# Patient Record
Sex: Female | Born: 1975 | Race: Black or African American | Hispanic: No | Marital: Married | Smoking: Current some day smoker
Health system: Southern US, Community
[De-identification: ages and names within clinical notes are randomized; demographics above are authoritative.]

## PROBLEM LIST (undated history)

## (undated) ENCOUNTER — Inpatient Hospital Stay (HOSPITAL_COMMUNITY): Payer: Self-pay

## (undated) DIAGNOSIS — K5090 Crohn's disease, unspecified, without complications: Secondary | ICD-10-CM

## (undated) DIAGNOSIS — K219 Gastro-esophageal reflux disease without esophagitis: Secondary | ICD-10-CM

## (undated) DIAGNOSIS — O09529 Supervision of elderly multigravida, unspecified trimester: Secondary | ICD-10-CM

## (undated) DIAGNOSIS — K220 Achalasia of cardia: Secondary | ICD-10-CM

## (undated) DIAGNOSIS — D219 Benign neoplasm of connective and other soft tissue, unspecified: Secondary | ICD-10-CM

## (undated) HISTORY — DX: Benign neoplasm of connective and other soft tissue, unspecified: D21.9

## (undated) HISTORY — DX: Achalasia of cardia: K22.0

## (undated) HISTORY — PX: ESOPHAGEAL DILATION: SHX303

## (undated) HISTORY — PX: OTHER SURGICAL HISTORY: SHX169

## (undated) HISTORY — DX: Supervision of elderly multigravida, unspecified trimester: O09.529

---

## 2011-03-27 ENCOUNTER — Other Ambulatory Visit: Payer: Self-pay | Admitting: Obstetrics and Gynecology

## 2011-03-27 DIAGNOSIS — N63 Unspecified lump in unspecified breast: Secondary | ICD-10-CM

## 2011-03-27 DIAGNOSIS — N644 Mastodynia: Secondary | ICD-10-CM

## 2011-04-13 ENCOUNTER — Ambulatory Visit
Admission: RE | Admit: 2011-04-13 | Discharge: 2011-04-13 | Disposition: A | Discharge status: Home / Self Care | Payer: BC Managed Care – PPO | Source: Ambulatory Visit | Attending: Obstetrics and Gynecology | Admitting: Obstetrics and Gynecology

## 2011-04-13 DIAGNOSIS — N63 Unspecified lump in unspecified breast: Secondary | ICD-10-CM

## 2011-04-13 DIAGNOSIS — N644 Mastodynia: Secondary | ICD-10-CM

## 2012-02-03 NOTE — L&D Delivery Note (Signed)
Pt had an amniotomy this am with clear fluid. She then progressed along a normal labor curve. She had a short second stage. She had a SVD on one live viable black female infant in the ROA position over a 1st degree midline tear. Placenta S/I. EBL- 400cc/ Tear closed with 3-0 Chromic. Baby to NBN.

## 2012-02-18 LAB — OB RESULTS CONSOLE GC/CHLAMYDIA: Gonorrhea: NEGATIVE

## 2012-02-18 LAB — OB RESULTS CONSOLE ANTIBODY SCREEN: Antibody Screen: NEGATIVE

## 2012-02-18 LAB — OB RESULTS CONSOLE ABO/RH

## 2012-07-26 ENCOUNTER — Inpatient Hospital Stay (HOSPITAL_COMMUNITY)
Admission: AD | Admit: 2012-07-26 | Discharge: 2012-07-26 | Disposition: A | Discharge status: Home / Self Care | Payer: BC Managed Care – PPO | Source: Ambulatory Visit | Attending: Obstetrics and Gynecology | Admitting: Obstetrics and Gynecology

## 2012-07-26 ENCOUNTER — Encounter (HOSPITAL_COMMUNITY): Payer: Self-pay | Admitting: *Deleted

## 2012-07-26 DIAGNOSIS — O4693 Antepartum hemorrhage, unspecified, third trimester: Secondary | ICD-10-CM

## 2012-07-26 DIAGNOSIS — O4690 Antepartum hemorrhage, unspecified, unspecified trimester: Secondary | ICD-10-CM | POA: Insufficient documentation

## 2012-07-26 DIAGNOSIS — N930 Postcoital and contact bleeding: Secondary | ICD-10-CM

## 2012-07-26 HISTORY — DX: Gastro-esophageal reflux disease without esophagitis: K21.9

## 2012-07-26 HISTORY — DX: Crohn's disease, unspecified, without complications: K50.90

## 2012-07-26 LAB — OB RESULTS CONSOLE GBS: GBS: POSITIVE

## 2012-07-26 NOTE — MAU Note (Signed)
Pt G2 P1 at 36wks reports bleeding after intercourse this evening.

## 2012-07-26 NOTE — MAU Provider Note (Signed)
Chief Complaint:  Vaginal Bleeding   First Provider Initiated Contact with Patient 07/26/12 0237     HPI: Dana Mcguire is a 37 y.o. G2P1001 at 108w0d who presents to maternity admissions reporting bleeding after intercourse tonight. She states she saw bright red blood on tissue when she wiped after intercourse. Scant bleeding since then. Denies contractions, leakage of fluid or vaginal bleeding. Has not been told that she has placenta previa or placed on pelvic rest this pregnancy. Good fetal movement.   Past Medical History: Past Medical History  Diagnosis Date  . Crohn's disease   . GERD (gastroesophageal reflux disease)     Past obstetric history: OB History   Grav Para Term Preterm Abortions TAB SAB Ect Mult Living   2 1 1       1      # Outc Date GA Lbr Len/2nd Wgt Sex Del Anes PTL Lv   1 TRM            2 CUR               Past Surgical History: Past Surgical History  Procedure Laterality Date  . Accolysia      Family History: Family History  Problem Relation Age of Onset  . Hypertension Mother   . Alcohol abuse Father   . Cancer Paternal Aunt   . Cancer Maternal Grandfather   . Cancer Paternal Grandfather     Social History: History  Substance Use Topics  . Smoking status: Former Smoker -- 23 years    Types: Cigarettes    Quit date: 07/27/2011  . Smokeless tobacco: Not on file  . Alcohol Use: Yes     Comment: not while pregnant    Allergies: Allergies not on file  Meds:  No prescriptions prior to admission    ROS: Pertinent findings in history of present illness.  Physical Exam  Blood pressure 129/74, pulse 97, temperature 98.2 F (36.8 C), temperature source Oral, resp. rate 16, height 5' 8.6" (1.742 m), weight 100.336 kg (221 lb 3.2 oz). GENERAL: Well-developed, well-nourished female in no acute distress. Anxious. HEENT: normocephalic HEART: normal rate RESP: normal effort ABDOMEN: Soft, non-tender, gravid appropriate for gestational  age EXTREMITIES: Nontender, no edema NEURO: alert and oriented SPECULUM EXAM: NEFG, small amount of thin, bloody mucus, cervix friable.   Dilation: Closed (Finger tip) Effacement (%): 50 Cervical Position: Posterior Exam by:: Ivonne Andrew CNM  FHT:  Baseline 120, moderate variability, accelerations present, no decelerations Contractions: UI   Labs: Fern negative.   Imaging:  No results found.  MAU Course: Discussed amount and circumstances of bleeding with Dr. Claiborne Billings. No further testing needed.  Assessment: 1. PCB (post coital bleeding)   2. Third trimester bleeding, antepartum    Plan: Discharge home Labor precautions and fetal kick counts. Pelvic rest x1 week. Bleeding precautions.    Medication List     As of 07/27/2012  6:19 AM    Notice      You have not been prescribed any medications.        Follow-up Information   Follow up with [facility] On 07/26/2012. (as scheduled)    Contact information:   [address] [place] [address] [phone]      Follow up with [facility] [place] [facility]ERN[facility] ADMISSIONS. (As needed if symptoms worsen)    Contact information:   [address] [id number] [place] [address] [phone]  [place], CNM 07/26/2012 2:10 AM

## 2012-08-23 ENCOUNTER — Telehealth (HOSPITAL_COMMUNITY): Payer: Self-pay | Admitting: *Deleted

## 2012-08-23 ENCOUNTER — Encounter (HOSPITAL_COMMUNITY): Payer: Self-pay | Admitting: *Deleted

## 2012-08-23 NOTE — Telephone Encounter (Signed)
Preadmission screen  

## 2012-08-29 ENCOUNTER — Encounter (HOSPITAL_COMMUNITY): Payer: Self-pay | Admitting: Anesthesiology

## 2012-08-29 ENCOUNTER — Inpatient Hospital Stay (HOSPITAL_COMMUNITY)
Admission: RE | Admit: 2012-08-29 | Discharge: 2012-08-31 | DRG: 373 | Disposition: A | Discharge status: Home / Self Care | Payer: BC Managed Care – PPO | Source: Ambulatory Visit | Attending: Obstetrics and Gynecology | Admitting: Obstetrics and Gynecology

## 2012-08-29 ENCOUNTER — Inpatient Hospital Stay (HOSPITAL_COMMUNITY): Payer: BC Managed Care – PPO | Admitting: Anesthesiology

## 2012-08-29 ENCOUNTER — Encounter (HOSPITAL_COMMUNITY): Payer: Self-pay

## 2012-08-29 DIAGNOSIS — O09529 Supervision of elderly multigravida, unspecified trimester: Secondary | ICD-10-CM | POA: Diagnosis present

## 2012-08-29 LAB — CBC
HCT: 31.7 % — ABNORMAL LOW (ref 36.0–46.0)
Hemoglobin: 11.2 g/dL — ABNORMAL LOW (ref 12.0–15.0)
MCV: 93 fL (ref 78.0–100.0)
RBC: 3.41 MIL/uL — ABNORMAL LOW (ref 3.87–5.11)
WBC: 7.9 10*3/uL (ref 4.0–10.5)

## 2012-08-29 MED ORDER — EPHEDRINE 5 MG/ML INJ: 10.0000 mg | INTRAVENOUS | Status: DC | PRN

## 2012-08-29 MED ORDER — EPHEDRINE 5 MG/ML INJ
10.0000 mg | INTRAVENOUS | Status: DC | PRN
  Filled 2012-08-29: qty 4
  Filled 2012-08-29: qty 2

## 2012-08-29 MED ORDER — PHENYLEPHRINE 40 MCG/ML (10ML) SYRINGE FOR IV PUSH (FOR BLOOD PRESSURE SUPPORT): 80.0000 ug | PREFILLED_SYRINGE | INTRAVENOUS | Status: DC | PRN

## 2012-08-29 MED ORDER — FENTANYL 2.5 MCG/ML BUPIVACAINE 1/10 % EPIDURAL INFUSION (WH - ANES): 14.0000 mL/h | INTRAMUSCULAR | Status: DC | PRN

## 2012-08-29 MED ORDER — FENTANYL 2.5 MCG/ML BUPIVACAINE 1/10 % EPIDURAL INFUSION (WH - ANES)
14.0000 mL/h | INTRAMUSCULAR | Status: DC | PRN
  Filled 2012-08-29: qty 125

## 2012-08-29 MED ORDER — MEASLES, MUMPS & RUBELLA VAC ~~LOC~~ INJ
0.5000 mL | INJECTION | Freq: Once | SUBCUTANEOUS | Status: DC
  Filled 2012-08-29: qty 0.5

## 2012-08-29 MED ORDER — DIPHENHYDRAMINE HCL 50 MG/ML IJ SOLN: 12.5000 mg | INTRAMUSCULAR | Status: DC | PRN

## 2012-08-29 MED ORDER — DIBUCAINE 1 % RE OINT: 1.0000 "application " | TOPICAL_OINTMENT | RECTAL | Status: DC | PRN

## 2012-08-29 MED ORDER — TETANUS-DIPHTH-ACELL PERTUSSIS 5-2.5-18.5 LF-MCG/0.5 IM SUSP: 0.5000 mL | Freq: Once | INTRAMUSCULAR | Status: DC

## 2012-08-29 MED ORDER — WITCH HAZEL-GLYCERIN EX PADS: 1.0000 "application " | MEDICATED_PAD | CUTANEOUS | Status: DC | PRN

## 2012-08-29 MED ORDER — IBUPROFEN 600 MG PO TABS
600.0000 mg | ORAL_TABLET | Freq: Four times a day (QID) | ORAL | Status: DC
  Administered 2012-08-29 – 2012-08-31 (×8): 600 mg via ORAL
  Filled 2012-08-29 (×8): qty 1

## 2012-08-29 MED ORDER — ACETAMINOPHEN 325 MG PO TABS: 650.0000 mg | ORAL_TABLET | ORAL | Status: DC | PRN

## 2012-08-29 MED ORDER — OXYCODONE-ACETAMINOPHEN 5-325 MG PO TABS: 1.0000 | ORAL_TABLET | ORAL | Status: DC | PRN

## 2012-08-29 MED ORDER — NALOXONE HCL 0.4 MG/ML IJ SOLN
INTRAMUSCULAR | Status: AC
  Filled 2012-08-29: qty 1

## 2012-08-29 MED ORDER — ONDANSETRON HCL 4 MG PO TABS
4.0000 mg | ORAL_TABLET | ORAL | Status: DC | PRN
  Administered 2012-08-31: 4 mg via ORAL
  Filled 2012-08-29: qty 1

## 2012-08-29 MED ORDER — LIDOCAINE HCL (PF) 1 % IJ SOLN
INTRAMUSCULAR | Status: AC
  Filled 2012-08-29: qty 30

## 2012-08-29 MED ORDER — LACTATED RINGERS IV SOLN: 500.0000 mL | Freq: Once | INTRAVENOUS | Status: DC

## 2012-08-29 MED ORDER — BENZOCAINE-MENTHOL 20-0.5 % EX AERO
1.0000 "application " | INHALATION_SPRAY | CUTANEOUS | Status: DC | PRN
  Administered 2012-08-29 – 2012-08-31 (×2): 1 via TOPICAL
  Filled 2012-08-29 (×2): qty 56

## 2012-08-29 MED ORDER — OXYTOCIN 40 UNITS IN LACTATED RINGERS INFUSION - SIMPLE MED
1.0000 m[IU]/min | INTRAVENOUS | Status: DC
  Administered 2012-08-29: 2 m[IU]/min via INTRAVENOUS
  Filled 2012-08-29: qty 1000

## 2012-08-29 MED ORDER — LACTATED RINGERS IV SOLN
500.0000 mL | Freq: Once | INTRAVENOUS | Status: AC
  Administered 2012-08-29: 500 mL via INTRAVENOUS

## 2012-08-29 MED ORDER — OXYCODONE-ACETAMINOPHEN 5-325 MG PO TABS
1.0000 | ORAL_TABLET | ORAL | Status: DC | PRN
  Administered 2012-08-29: 1 via ORAL
  Administered 2012-08-30 – 2012-08-31 (×3): 2 via ORAL
  Filled 2012-08-29 (×3): qty 2
  Filled 2012-08-29: qty 1

## 2012-08-29 MED ORDER — ZOLPIDEM TARTRATE 5 MG PO TABS: 5.0000 mg | ORAL_TABLET | Freq: Every evening | ORAL | Status: DC | PRN

## 2012-08-29 MED ORDER — FLEET ENEMA 7-19 GM/118ML RE ENEM: 1.0000 | ENEMA | RECTAL | Status: DC | PRN

## 2012-08-29 MED ORDER — CITRIC ACID-SODIUM CITRATE 334-500 MG/5ML PO SOLN: 30.0000 mL | ORAL | Status: DC | PRN

## 2012-08-29 MED ORDER — LACTATED RINGERS IV SOLN
INTRAVENOUS | Status: DC
  Administered 2012-08-29: 09:00:00 via INTRAVENOUS

## 2012-08-29 MED ORDER — ONDANSETRON HCL 4 MG/2ML IJ SOLN: 4.0000 mg | Freq: Four times a day (QID) | INTRAMUSCULAR | Status: DC | PRN

## 2012-08-29 MED ORDER — OXYTOCIN 40 UNITS IN LACTATED RINGERS INFUSION - SIMPLE MED: 62.5000 mL/h | INTRAVENOUS | Status: DC

## 2012-08-29 MED ORDER — PENICILLIN G POTASSIUM 5000000 UNITS IJ SOLR
2.5000 10*6.[IU] | INTRAMUSCULAR | Status: DC
  Filled 2012-08-29: qty 5

## 2012-08-29 MED ORDER — PRENATAL MULTIVITAMIN CH
1.0000 | ORAL_TABLET | Freq: Every day | ORAL | Status: DC
  Administered 2012-08-31: 1 via ORAL
  Filled 2012-08-29 (×2): qty 1

## 2012-08-29 MED ORDER — PENICILLIN G POTASSIUM 5000000 UNITS IJ SOLR
5.0000 10*6.[IU] | Freq: Once | INTRAMUSCULAR | Status: DC
  Filled 2012-08-29: qty 5

## 2012-08-29 MED ORDER — PENICILLIN G POTASSIUM 5000000 UNITS IJ SOLR
2.5000 10*6.[IU] | INTRAMUSCULAR | Status: DC
  Administered 2012-08-29: 2.5 10*6.[IU] via INTRAVENOUS
  Filled 2012-08-29 (×5): qty 2.5

## 2012-08-29 MED ORDER — LACTATED RINGERS IV SOLN: 500.0000 mL | INTRAVENOUS | Status: DC | PRN

## 2012-08-29 MED ORDER — TERBUTALINE SULFATE 1 MG/ML IJ SOLN: 0.2500 mg | Freq: Once | INTRAMUSCULAR | Status: DC | PRN

## 2012-08-29 MED ORDER — IBUPROFEN 600 MG PO TABS: 600.0000 mg | ORAL_TABLET | Freq: Four times a day (QID) | ORAL | Status: DC | PRN

## 2012-08-29 MED ORDER — PHENYLEPHRINE 40 MCG/ML (10ML) SYRINGE FOR IV PUSH (FOR BLOOD PRESSURE SUPPORT)
80.0000 ug | PREFILLED_SYRINGE | INTRAVENOUS | Status: DC | PRN
  Filled 2012-08-29: qty 2
  Filled 2012-08-29: qty 5

## 2012-08-29 MED ORDER — PENICILLIN G POTASSIUM 5000000 UNITS IJ SOLR
5.0000 10*6.[IU] | Freq: Once | INTRAVENOUS | Status: AC
  Administered 2012-08-29: 5 10*6.[IU] via INTRAVENOUS
  Filled 2012-08-29: qty 5

## 2012-08-29 MED ORDER — SIMETHICONE 80 MG PO CHEW: 80.0000 mg | CHEWABLE_TABLET | ORAL | Status: DC | PRN

## 2012-08-29 MED ORDER — ONDANSETRON HCL 4 MG/2ML IJ SOLN: 4.0000 mg | INTRAMUSCULAR | Status: DC | PRN

## 2012-08-29 MED ORDER — OXYTOCIN BOLUS FROM INFUSION: 500.0000 mL | INTRAVENOUS | Status: DC

## 2012-08-29 MED ORDER — LIDOCAINE HCL (PF) 1 % IJ SOLN: 30.0000 mL | INTRAMUSCULAR | Status: DC | PRN

## 2012-08-29 MED ORDER — FENTANYL 2.5 MCG/ML BUPIVACAINE 1/10 % EPIDURAL INFUSION (WH - ANES)
INTRAMUSCULAR | Status: DC | PRN
  Administered 2012-08-29: 15 mL/h via EPIDURAL

## 2012-08-29 MED ORDER — SENNOSIDES-DOCUSATE SODIUM 8.6-50 MG PO TABS
2.0000 | ORAL_TABLET | Freq: Every day | ORAL | Status: DC
  Administered 2012-08-29 – 2012-08-30 (×2): 2 via ORAL

## 2012-08-29 MED ORDER — LIDOCAINE HCL (PF) 1 % IJ SOLN
INTRAMUSCULAR | Status: DC | PRN
  Administered 2012-08-29 (×2): 4 mL

## 2012-08-29 NOTE — Anesthesia Preprocedure Evaluation (Signed)
Anesthesia Evaluation  Patient identified by MRN, date of birth, ID band Patient awake    Reviewed: Allergy & Precautions, H&P , Patient's Chart, lab work & pertinent test results  Airway Mallampati: III TM Distance: >3 FB Neck ROM: Full    Dental no notable dental hx. (+) Teeth Intact   Pulmonary neg pulmonary ROS, former smoker,  breath sounds clear to auscultation  Pulmonary exam normal       Cardiovascular negative cardio ROS  Rhythm:Regular Rate:Normal     Neuro/Psych negative neurological ROS  negative psych ROS   GI/Hepatic Neg liver ROS, GERD-  Medicated and Controlled,Achalasia Crohn's Disease   Endo/Other  negative endocrine ROS  Renal/GU negative Renal ROS  negative genitourinary   Musculoskeletal negative musculoskeletal ROS (+)   Abdominal (+) + obese,   Peds  Hematology negative hematology ROS (+)   Anesthesia Other Findings   Reproductive/Obstetrics (+) Pregnancy                           Anesthesia Physical Anesthesia Plan  ASA: II  Anesthesia Plan: Epidural   Post-op Pain Management:    Induction:   Airway Management Planned: Natural Airway  Additional Equipment:   Intra-op Plan:   Post-operative Plan:   Informed Consent: I have reviewed the patients History and Physical, chart, labs and discussed the procedure including the risks, benefits and alternatives for the proposed anesthesia with the patient or authorized representative who has indicated his/her understanding and acceptance.   Dental advisory given  Plan Discussed with: Anesthesiologist  Anesthesia Plan Comments:         Anesthesia Quick Evaluation

## 2012-08-29 NOTE — H&P (Signed)
Pt is a 37 yr old black female G2P1 at term who is admitted for an induction. Her pregnancy was complicated by AMA. She had a normal first trimester screen.  PMHX: see holister PE: VSSAF        ABD-gravid, non tender. IMP/ IUP at term         [facility] Plan/admit

## 2012-08-29 NOTE — Anesthesia Procedure Notes (Signed)
Epidural Patient location during procedure: OB Start time: 08/29/2012 11:51 AM  Staffing Anesthesiologist: Tremane Spurgeon A. Performed by: anesthesiologist   Preanesthetic Checklist Completed: patient identified, site marked, surgical consent, pre-op evaluation, timeout performed, IV checked, risks and benefits discussed and monitors and equipment checked  Epidural Patient position: sitting Prep: site prepped and draped and DuraPrep Patient monitoring: continuous pulse ox and blood pressure Approach: midline Injection technique: LOR air  Needle:  Needle type: Tuohy  Needle gauge: 17 G Needle length: 9 cm and 9 Needle insertion depth: 7 cm Catheter type: closed end flexible Catheter size: 19 Gauge Catheter at skin depth: 12 cm Test dose: negative and Other  Assessment Events: blood not aspirated, injection not painful, no injection resistance, negative IV test and no paresthesia  Additional Notes Patient identified. Risks and benefits discussed including failed block, incomplete  Pain control, post dural puncture headache, nerve damage, paralysis, blood pressure Changes, nausea, vomiting, reactions to medications-both toxic and allergic and post Partum back pain. All questions were answered. Patient expressed understanding and wished to proceed. Sterile technique was used throughout procedure. Epidural site was Dressed with sterile barrier dressing. No paresthesias, signs of intravascular injection Or signs of intrathecal spread were encountered.  Patient was more comfortable after the epidural was dosed. Please see RN's note for documentation of vital signs and FHR which are stable.

## 2012-08-30 LAB — CBC
MCH: 31.8 pg (ref 26.0–34.0)
MCHC: 34.7 g/dL (ref 30.0–36.0)
RDW: 14.2 % (ref 11.5–15.5)

## 2012-08-30 NOTE — Progress Notes (Signed)
Patient is eating, ambulating, voiding.  Pain control is good.  Filed Vitals:   08/29/12 1747 08/29/12 1910 08/29/12 2224 08/30/12 0535  BP: 124/83 124/83 118/70 120/75  Pulse: 77 73 78 73  Temp:  98.5 F (36.9 C) 98.3 F (36.8 C) 98 F (36.7 C)  TempSrc: Oral Oral Oral Oral  Resp: 20 20 20 18   Height:      Weight:      SpO2:        Fundus firm Perineum without swelling.  Lab Results  Component Value Date   WBC 14.2* 08/30/2012   HGB 10.7* 08/30/2012   HCT 30.8* 08/30/2012   MCV 91.7 08/30/2012   PLT 256 08/30/2012    A/Positive/-- (01/16 0000)/RI  A/P Post partum day 1.  Routine care.  Expect d/c tomorrow.  Circ tomorrow.  Rashea Hoskie A

## 2012-08-31 NOTE — Anesthesia Postprocedure Evaluation (Signed)
  Anesthesia Post-op Note Anesthesia Post Note  Patient: Dana Mcguire  Procedure(s) Performed: * No procedures listed *  Anesthesia type: Epidural  Patient location: Mother/Baby  Post pain: Pain level controlled  Post assessment: Post-op Vital signs reviewed  Last Vitals:  Filed Vitals:   08/31/12 0635  BP: 117/67  Pulse: 65  Temp: 36.6 C  Resp: 20    Post vital signs: Reviewed  Level of consciousness:alert  Complications: No apparent anesthesia complications

## 2012-08-31 NOTE — Discharge Summary (Signed)
Obstetric Discharge Summary Reason for Admission: induction of labor Prenatal Procedures: none Intrapartum Procedures: spontaneous vaginal delivery Postpartum Procedures: none Complications-Operative and Postpartum: 1st degree perineal laceration Hemoglobin  Date Value Range Status  08/30/2012 10.7* 12.0 - 15.0 g/dL Final     HCT  Date Value Range Status  08/30/2012 30.8* 36.0 - 46.0 % Final    Physical Exam:  General: alert and cooperative Lochia: appropriate Uterine Fundus: firm DVT Evaluation: No evidence of DVT seen on physical exam.  Discharge Diagnoses: Term Pregnancy-delivered  Discharge Information: Date: 08/31/2012 Activity: pelvic rest Diet: routine Medications: PNV and Ibuprofen Condition: stable Instructions: refer to practice specific booklet Discharge to: home Follow-up Information   Follow up with Levi Aland, MD In 4 weeks.   Contact information:   [address] [place] [address] [phone]       Newborn Data: Live born female  Birth Weight: 8 lb 4 oz (3742 g) APGAR: 9, 9  Home with mother.  Philip Aspen 08/31/2012, 9:44 AM

## 2013-12-04 ENCOUNTER — Encounter (HOSPITAL_COMMUNITY): Payer: Self-pay

## 2014-12-17 ENCOUNTER — Other Ambulatory Visit: Payer: Self-pay

## 2014-12-17 DIAGNOSIS — Z1231 Encounter for screening mammogram for malignant neoplasm of breast: Secondary | ICD-10-CM

## 2015-03-12 ENCOUNTER — Other Ambulatory Visit: Payer: Self-pay

## 2015-03-12 DIAGNOSIS — Z1231 Encounter for screening mammogram for malignant neoplasm of breast: Secondary | ICD-10-CM

## 2015-04-19 ENCOUNTER — Ambulatory Visit: Payer: Self-pay

## 2015-04-26 ENCOUNTER — Ambulatory Visit
Admission: RE | Admit: 2015-04-26 | Discharge: 2015-04-26 | Disposition: A | Discharge status: Home / Self Care | Payer: Managed Care, Other (non HMO) | Source: Ambulatory Visit

## 2015-04-26 DIAGNOSIS — Z1231 Encounter for screening mammogram for malignant neoplasm of breast: Secondary | ICD-10-CM

## 2015-10-22 ENCOUNTER — Encounter (HOSPITAL_COMMUNITY): Payer: Self-pay | Admitting: *Deleted

## 2015-11-05 ENCOUNTER — Other Ambulatory Visit: Payer: Self-pay | Admitting: Obstetrics and Gynecology

## 2015-11-06 NOTE — Patient Instructions (Addendum)
Your procedure is scheduled on:  Wednesday, Oct. 11, 2017   Enter through the [employer] of [facility] at:  7:00 AM  Pick up the phone at the desk and dial [phone].  Call this number if you have problems the morning of surgery: [phone].  Remember: Do NOT eat food or drink after:  Midnight Tuesday  Take these medicines the morning of surgery with a SIP OF WATER:  Achiphex  Do Not smoke the day of surgery  Stop Vitamin E at this time  Stop ALL herbal medications 2-3 weeks before procedure   Do NOT wear jewelry (body piercing), metal hair clips/bobby pins, make-up, or nail polish. Do NOT wear lotions, powders, or perfumes.  You may wear deodorant. Do NOT shave for 48 hours prior to surgery. Do NOT bring valuables to the hospital. Contacts, dentures, or bridgework may not be worn into surgery.  Have a responsible adult drive you home and stay with you for 24 hours after your procedure

## 2015-11-07 ENCOUNTER — Encounter (HOSPITAL_COMMUNITY)
Admission: RE | Admit: 2015-11-07 | Discharge: 2015-11-07 | Disposition: A | Discharge status: Home / Self Care | Payer: Managed Care, Other (non HMO) | Source: Ambulatory Visit | Attending: Obstetrics and Gynecology | Admitting: Obstetrics and Gynecology

## 2015-11-07 ENCOUNTER — Encounter (HOSPITAL_COMMUNITY): Payer: Self-pay

## 2015-11-07 DIAGNOSIS — N946 Dysmenorrhea, unspecified: Secondary | ICD-10-CM | POA: Diagnosis not present

## 2015-11-07 DIAGNOSIS — N920 Excessive and frequent menstruation with regular cycle: Secondary | ICD-10-CM | POA: Insufficient documentation

## 2015-11-07 DIAGNOSIS — Z01812 Encounter for preprocedural laboratory examination: Secondary | ICD-10-CM | POA: Insufficient documentation

## 2015-11-07 LAB — CBC
HCT: 34.9 % — ABNORMAL LOW (ref 36.0–46.0)
HEMOGLOBIN: 12 g/dL (ref 12.0–15.0)
MCH: 30 pg (ref 26.0–34.0)
MCHC: 34.4 g/dL (ref 30.0–36.0)
MCV: 87.3 fL (ref 78.0–100.0)
Platelets: 394 10*3/uL (ref 150–400)
RBC: 4 MIL/uL (ref 3.87–5.11)
RDW: 13.7 % (ref 11.5–15.5)
WBC: 6.7 10*3/uL (ref 4.0–10.5)

## 2015-11-12 NOTE — H&P (Signed)
40 y.o. yo complains of menorrhagia.  Previously:"Menorrhagia from small submucosal fibroids and polyp; pt offered conservative vs definitive(hyst)- desires conservative- D&C, hysteroscopy, Novasure and Myosure."  "US Pelvic TV- 9x5x5; EM 1.2 cm; two fibroids posterior pushing on endo canal; Ovaries normal with area of ovulation in L; FF cul de sac. Also 1.6 cm hyperechoic area posterior wall c/w polyp..  Past Medical History:  Diagnosis Date  . Achalasia   . AMA (advanced maternal age) multigravida 58+   . Crohn's disease ([place])   . Fibroid   . GERD (gastroesophageal reflux disease)    Past Surgical History:  Procedure Laterality Date  . accolysia    . ESOPHAGEAL DILATION      Social History   Social History  . Marital status: Married    Spouse name: N/A  . Number of children: N/A  . Years of education: N/A   Occupational History  . Not on file.   Social History Main Topics  . Smoking status: Current Some Day Smoker    Years: 23.00    Types: E-cigarettes  . Smokeless tobacco: Never Used  . Alcohol use Yes  . Drug use: No  . Sexual activity: Yes    Birth control/ protection: None   Other Topics Concern  . Not on file   Social History Narrative  . No narrative on file    No current facility-administered medications on file prior to encounter.    Current Outpatient Prescriptions on File Prior to Encounter  Medication Sig Dispense Refill  . Prenatal Vit-Fe Fumarate-FA (PRENATAL MULTIVITAMIN) TABS Take 1 tablet by mouth daily at 12 noon.    . RABEprazole (ACIPHEX) 20 MG tablet Take 20 mg by mouth daily as needed (for relux).     . vitamin E 400 UNIT capsule Take 400 Units by mouth daily.      Allergies  Allergen Reactions  . Clindamycin/Lincomycin Nausea And Vomiting    300mg  dose  . Flagyl [Metronidazole] Hives  . Latex Hives  . Lidocaine     Makes heart race  . Sulfa Antibiotics Hives    @VITALS2 @  Lungs: clear to ascultation Cor:  RRR Abdomen:  soft,  nontender, nondistended. Ex:  no cords, erythema Pelvic:    Vulva: no masses, no atrophy, no lesions\ls1 Vagina: no tenderness, no erythema, no abnormal vaginal discharge, no vesicle(s) or ulcers, no rectocele, cystocele\ls1   Cervix: grossly normal, no discharge, no cervical motion tenderness\ls1   Uterus: normal size (9- hard to feel fundus but normal shape; well suspended), normal shape, midline, no uterine prolapse, non-tender\ls1   Bladder/Urethra: no urethral discharge, no urethral mass, bladder non distended, Urethra hypermobile (about 45 degrees)\ls1   Adnexa/Parametria: no parametrial tenderness, no parametrial mass, no adnexal tenderness, no ovarian mass  A:  For d&c, hysteroscopy, D7C, possible polyp removal, possible myosure for fibroid in EM, Novasure.   P: All risks, benefits and alternatives d/w patient and she desires to proceed.  Patient has undergone a modified bowel prep and will receive preop antibiotics and SCDs during the operation.     Kisha Messman A

## 2015-11-13 ENCOUNTER — Ambulatory Visit (HOSPITAL_COMMUNITY)
Admission: RE | Admit: 2015-11-13 | Discharge: 2015-11-13 | Disposition: A | Discharge status: Home / Self Care | Payer: Managed Care, Other (non HMO) | Source: Ambulatory Visit | Attending: Obstetrics and Gynecology | Admitting: Obstetrics and Gynecology

## 2015-11-13 ENCOUNTER — Encounter (HOSPITAL_COMMUNITY): Payer: Self-pay | Admitting: Anesthesiology

## 2015-11-13 ENCOUNTER — Encounter (HOSPITAL_COMMUNITY)
Admission: RE | Disposition: A | Discharge status: Home / Self Care | Payer: Self-pay | Source: Ambulatory Visit | Attending: Obstetrics and Gynecology

## 2015-11-13 ENCOUNTER — Ambulatory Visit (HOSPITAL_COMMUNITY): Payer: Managed Care, Other (non HMO) | Admitting: Anesthesiology

## 2015-11-13 DIAGNOSIS — N946 Dysmenorrhea, unspecified: Secondary | ICD-10-CM | POA: Diagnosis not present

## 2015-11-13 DIAGNOSIS — F17210 Nicotine dependence, cigarettes, uncomplicated: Secondary | ICD-10-CM | POA: Diagnosis not present

## 2015-11-13 DIAGNOSIS — K219 Gastro-esophageal reflux disease without esophagitis: Secondary | ICD-10-CM | POA: Diagnosis not present

## 2015-11-13 DIAGNOSIS — K5090 Crohn's disease, unspecified, without complications: Secondary | ICD-10-CM | POA: Diagnosis not present

## 2015-11-13 DIAGNOSIS — N920 Excessive and frequent menstruation with regular cycle: Secondary | ICD-10-CM | POA: Diagnosis present

## 2015-11-13 HISTORY — PX: DILITATION & CURRETTAGE/HYSTROSCOPY WITH NOVASURE ABLATION: SHX5568

## 2015-11-13 LAB — PREGNANCY, URINE: Preg Test, Ur: NEGATIVE

## 2015-11-13 LAB — TYPE AND SCREEN
ABO/RH(D): A POS
Antibody Screen: NEGATIVE

## 2015-11-13 LAB — ABO/RH: ABO/RH(D): A POS

## 2015-11-13 SURGERY — DILATATION & CURETTAGE/HYSTEROSCOPY WITH NOVASURE ABLATION
Anesthesia: General

## 2015-11-13 MED ORDER — LIDOCAINE HCL (CARDIAC) 20 MG/ML IV SOLN
INTRAVENOUS | Status: AC
  Filled 2015-11-13: qty 5

## 2015-11-13 MED ORDER — PROPOFOL 10 MG/ML IV BOLUS
INTRAVENOUS | Status: AC
  Filled 2015-11-13: qty 20

## 2015-11-13 MED ORDER — DEXAMETHASONE SODIUM PHOSPHATE 4 MG/ML IJ SOLN
INTRAMUSCULAR | Status: AC
  Filled 2015-11-13: qty 1

## 2015-11-13 MED ORDER — DEXAMETHASONE SODIUM PHOSPHATE 10 MG/ML IJ SOLN
INTRAMUSCULAR | Status: DC | PRN
  Administered 2015-11-13: 4 mg via INTRAVENOUS

## 2015-11-13 MED ORDER — SCOPOLAMINE 1 MG/3DAYS TD PT72
1.0000 | MEDICATED_PATCH | Freq: Once | TRANSDERMAL | Status: DC
  Administered 2015-11-13: 1.5 mg via TRANSDERMAL

## 2015-11-13 MED ORDER — SODIUM CHLORIDE 0.9 % IR SOLN
Status: DC | PRN
  Administered 2015-11-13: 3000 mL

## 2015-11-13 MED ORDER — ONDANSETRON HCL 4 MG/2ML IJ SOLN: 4.0000 mg | Freq: Once | INTRAMUSCULAR | Status: DC | PRN

## 2015-11-13 MED ORDER — FENTANYL CITRATE (PF) 100 MCG/2ML IJ SOLN
INTRAMUSCULAR | Status: AC
  Administered 2015-11-13: 50 ug via INTRAVENOUS
  Filled 2015-11-13: qty 2

## 2015-11-13 MED ORDER — KETOROLAC TROMETHAMINE 30 MG/ML IJ SOLN: 30.0000 mg | Freq: Once | INTRAMUSCULAR | Status: DC

## 2015-11-13 MED ORDER — MEPERIDINE HCL 25 MG/ML IJ SOLN
INTRAMUSCULAR | Status: AC
  Filled 2015-11-13: qty 1

## 2015-11-13 MED ORDER — FENTANYL CITRATE (PF) 100 MCG/2ML IJ SOLN
INTRAMUSCULAR | Status: DC | PRN
  Administered 2015-11-13: 50 ug via INTRAVENOUS

## 2015-11-13 MED ORDER — KETOROLAC TROMETHAMINE 30 MG/ML IJ SOLN
INTRAMUSCULAR | Status: DC | PRN
  Administered 2015-11-13: 30 mg via INTRAVENOUS

## 2015-11-13 MED ORDER — MIDAZOLAM HCL 2 MG/2ML IJ SOLN
INTRAMUSCULAR | Status: DC | PRN
  Administered 2015-11-13: 2 mg via INTRAVENOUS

## 2015-11-13 MED ORDER — LIDOCAINE HCL (CARDIAC) 20 MG/ML IV SOLN
INTRAVENOUS | Status: DC | PRN
  Administered 2015-11-13: 100 mg via INTRAVENOUS

## 2015-11-13 MED ORDER — GLYCOPYRROLATE 0.2 MG/ML IJ SOLN
INTRAMUSCULAR | Status: DC | PRN
  Administered 2015-11-13: 0.1 mg via INTRAVENOUS

## 2015-11-13 MED ORDER — LIDOCAINE HCL 1 % IJ SOLN
INTRAMUSCULAR | Status: AC
  Filled 2015-11-13: qty 20

## 2015-11-13 MED ORDER — SCOPOLAMINE 1 MG/3DAYS TD PT72
MEDICATED_PATCH | TRANSDERMAL | Status: AC
  Administered 2015-11-13: 1.5 mg via TRANSDERMAL
  Filled 2015-11-13: qty 1

## 2015-11-13 MED ORDER — KETOROLAC TROMETHAMINE 30 MG/ML IJ SOLN
INTRAMUSCULAR | Status: AC
  Filled 2015-11-13: qty 1

## 2015-11-13 MED ORDER — FENTANYL CITRATE (PF) 100 MCG/2ML IJ SOLN
INTRAMUSCULAR | Status: AC
  Filled 2015-11-13: qty 2

## 2015-11-13 MED ORDER — MIDAZOLAM HCL 2 MG/2ML IJ SOLN
INTRAMUSCULAR | Status: AC
  Filled 2015-11-13: qty 2

## 2015-11-13 MED ORDER — LACTATED RINGERS IV SOLN
INTRAVENOUS | Status: DC
  Administered 2015-11-13 (×2): via INTRAVENOUS

## 2015-11-13 MED ORDER — GLYCOPYRROLATE 0.2 MG/ML IJ SOLN
INTRAMUSCULAR | Status: AC
  Filled 2015-11-13: qty 1

## 2015-11-13 MED ORDER — ONDANSETRON HCL 4 MG/2ML IJ SOLN
INTRAMUSCULAR | Status: DC | PRN
  Administered 2015-11-13: 4 mg via INTRAVENOUS

## 2015-11-13 MED ORDER — LIDOCAINE HCL 1 % IJ SOLN
INTRAMUSCULAR | Status: DC | PRN
  Administered 2015-11-13: 10 mL

## 2015-11-13 MED ORDER — ONDANSETRON HCL 4 MG/2ML IJ SOLN
INTRAMUSCULAR | Status: AC
  Filled 2015-11-13: qty 2

## 2015-11-13 MED ORDER — FENTANYL CITRATE (PF) 100 MCG/2ML IJ SOLN
25.0000 ug | INTRAMUSCULAR | Status: DC | PRN
  Administered 2015-11-13 (×2): 50 ug via INTRAVENOUS

## 2015-11-13 MED ORDER — PROPOFOL 10 MG/ML IV BOLUS
INTRAVENOUS | Status: DC | PRN
  Administered 2015-11-13 (×2): 100 mg via INTRAVENOUS

## 2015-11-13 MED ORDER — MEPERIDINE HCL 25 MG/ML IJ SOLN
6.2500 mg | INTRAMUSCULAR | Status: DC | PRN
  Administered 2015-11-13: 12.5 mg via INTRAVENOUS

## 2015-11-13 SURGICAL SUPPLY — 23 items
ABLATOR ENDOMETRIAL BIPOLAR (ABLATOR) ×3 IMPLANT
BIPOLAR CUTTING LOOP 21FR (ELECTRODE)
CANISTER SUCT 3000ML (MISCELLANEOUS) ×3 IMPLANT
CATH ROBINSON RED A/P 16FR (CATHETERS) ×3 IMPLANT
CLOTH BEACON ORANGE TIMEOUT ST (SAFETY) ×3 IMPLANT
CONTAINER PREFILL 10% NBF 60ML (FORM) ×6 IMPLANT
DEVICE MYOSURE LITE (MISCELLANEOUS) IMPLANT
DEVICE MYOSURE REACH (MISCELLANEOUS) IMPLANT
ELECT REM PT RETURN 9FT ADLT (ELECTROSURGICAL)
ELECTRODE REM PT RTRN 9FT ADLT (ELECTROSURGICAL) IMPLANT
FILTER ARTHROSCOPY CONVERTOR (FILTER) ×3 IMPLANT
GLOVE BIO SURGEON STRL SZ7 (GLOVE) ×3 IMPLANT
GLOVE BIOGEL PI IND STRL 7.0 (GLOVE) ×1 IMPLANT
GLOVE BIOGEL PI INDICATOR 7.0 (GLOVE) ×2
GOWN STRL REUS W/TWL LRG LVL3 (GOWN DISPOSABLE) ×6 IMPLANT
LOOP CUTTING BIPOLAR 21FR (ELECTRODE) IMPLANT
PACK VAGINAL MINOR WOMEN LF (CUSTOM PROCEDURE TRAY) ×3 IMPLANT
PAD OB MATERNITY 4.3X12.25 (PERSONAL CARE ITEMS) ×3 IMPLANT
SEAL ROD LENS SCOPE MYOSURE (ABLATOR) ×3 IMPLANT
TOWEL OR 17X24 6PK STRL BLUE (TOWEL DISPOSABLE) ×6 IMPLANT
TUBING AQUILEX INFLOW (TUBING) ×3 IMPLANT
TUBING AQUILEX OUTFLOW (TUBING) ×3 IMPLANT
WATER STERILE IRR 1000ML POUR (IV SOLUTION) ×3 IMPLANT

## 2015-11-13 NOTE — Anesthesia Procedure Notes (Signed)
Procedure Name: LMA Insertion Date/Time: 11/13/2015 8:30 AM Performed by: Raenette Rover Pre-anesthesia Checklist: Patient identified, Emergency Drugs available, Suction available and Patient being monitored Patient Re-evaluated:Patient Re-evaluated prior to inductionOxygen Delivery Method: Circle system utilized Preoxygenation: Pre-oxygenation with 100% oxygen Intubation Type: IV induction Ventilation: Mask ventilation without difficulty LMA: LMA with gastric port inserted LMA Size: 4.0 Number of attempts: 3 (Attempted placement of LMA#4 Ambu AuraStraight LMA x2--large leak despite adequate cuff inflation and attempted LMA manipulation; successful placement of LMA#4 Supreme x1 attempt) Placement Confirmation: positive ETCO2,  CO2 detector and breath sounds checked- equal and bilateral Tube secured with: Tape Dental Injury: Teeth and Oropharynx as per pre-operative assessment

## 2015-11-13 NOTE — Transfer of Care (Signed)
Immediate Anesthesia Transfer of Care Note  Patient: Dana Mcguire  Procedure(s) Performed: Procedure(s): DILATATION & CURETTAGE/HYSTEROSCOPY WITH NOVASURE ABLATION (N/A)  Patient Location: PACU  Anesthesia Type:General  Level of Consciousness: awake, alert , oriented and patient cooperative  Airway & Oxygen Therapy: Patient Spontanous Breathing and Patient connected to nasal cannula oxygen  Post-op Assessment: Report given to RN and Post -op Vital signs reviewed and stable  Post vital signs: Reviewed and stable  Last Vitals:  Vitals:   11/13/15 0721  BP: 122/81  Pulse: 66  Resp: 20  Temp: 36.4 C    Last Pain:  Vitals:   11/13/15 0721  TempSrc: Oral      Patients Stated Pain Goal: 4 ([id number])  Complications: No apparent anesthesia complications

## 2015-11-13 NOTE — Discharge Instructions (Signed)

## 2015-11-13 NOTE — Anesthesia Preprocedure Evaluation (Signed)
Anesthesia Evaluation  Patient identified by MRN, date of birth, ID band Patient awake    Reviewed: Allergy & Precautions, H&P , NPO status , Patient's Chart, lab work & pertinent test results  Airway Mallampati: I  TM Distance: >3 FB Neck ROM: full    Dental no notable dental hx. (+) Teeth Intact   Pulmonary neg pulmonary ROS, Current Smoker,    Pulmonary exam normal        Cardiovascular negative cardio ROS Normal cardiovascular exam     Neuro/Psych negative neurological ROS  negative psych ROS   GI/Hepatic Neg liver ROS, GERD  Medicated and Controlled,  Endo/Other  negative endocrine ROS  Renal/GU negative Renal ROS     Musculoskeletal   Abdominal Normal abdominal exam  (+)   Peds  Hematology negative hematology ROS (+)   Anesthesia Other Findings   Reproductive/Obstetrics negative OB ROS                             Anesthesia Physical Anesthesia Plan  ASA: II  Anesthesia Plan: General   Post-op Pain Management:    Induction:   Airway Management Planned: LMA  Additional Equipment:   Intra-op Plan:   Post-operative Plan:   Informed Consent: I have reviewed the patients History and Physical, chart, labs and discussed the procedure including the risks, benefits and alternatives for the proposed anesthesia with the patient or authorized representative who has indicated his/her understanding and acceptance.   Dental Advisory Given  Plan Discussed with: CRNA and Surgeon  Anesthesia Plan Comments:         Anesthesia Quick Evaluation

## 2015-11-13 NOTE — Brief Op Note (Signed)
11/13/2015  8:57 AM  PATIENT:  Dana Mcguire  40 y.o. female  PRE-OPERATIVE DIAGNOSIS:  DYSMENORRHEA MENORRHAGIA  POST-OPERATIVE DIAGNOSIS:  dysmenorrhea, menorrhagia,   PROCEDURE:  Procedure(s): DILATATION & CURETTAGE/HYSTEROSCOPY WITH NOVASURE ABLATION (N/A)  SURGEON:  Surgeon(s) and Role:    * Bobbye Charleston, MD - Primary  ANESTHESIA:   general  EBL:  Total I/O In: 1000 [I.V.:1000] Out: 96 [Urine:50; Blood:5]  LOCAL MEDICATIONS USED:  LIDOCAINE   SPECIMEN:  Source of Specimen:  uterine currettings  DISPOSITION OF SPECIMEN:  PATHOLOGY  COUNTS:  YES  TOURNIQUET:  * No tourniquets in log *  DICTATION: .Note written in EPIC  PLAN OF CARE: Discharge to home after PACU  PATIENT DISPOSITION:  PACU - hemodynamically stable.   Delay start of Pharmacological VTE agent (>24hrs) due to surgical blood loss or risk of bleeding: not applicable  Findings:  Shaggy endometrium.  No independent polyps seen and no fibroids were pushing into EM.  Medications: none.  Complications: none.  After adequate anesthesia was achieved, the patient was prepped and draped in the usual sterile fashion.  The speculum was placed in the vagina and the cervix stabilized with a single-tooth tenaculum.  The cervix was dilated with pratt dilators and the hysteroscope passed inside the endometrial cavity.  The above findings were noted and sharp curettage was then performed and uterine curettings sent to path.  Without discreet polyps or fibroids seen, the myosure was not used.  The cavity length was 5 cm and the Novasure instrument successfully seated.  The width was 4.8 cm and the CO2 test passed.  The ablation went for 58 sec at at power of 132 watts.  Once the ablation was completed, the Novasure instrument was removed and the hysteroscope passed into the cavity again.  Good contact was seen in all areas.   All instruments were then removed from the uterus and vagina.  The patient tolerated the  procedure well.    Dana Mcguire A

## 2015-11-13 NOTE — Op Note (Signed)
11/13/2015  8:57 AM  PATIENT:  Dana Mcguire  40 y.o. female  PRE-OPERATIVE DIAGNOSIS:  DYSMENORRHEA MENORRHAGIA  POST-OPERATIVE DIAGNOSIS:  dysmenorrhea, menorrhagia,   PROCEDURE:  Procedure(s): DILATATION & CURETTAGE/HYSTEROSCOPY WITH NOVASURE ABLATION (N/Mcguire)  SURGEON:  Surgeon(s) and Role:    * Bobbye Charleston, MD - Primary  ANESTHESIA:   general  EBL:  Total I/O In: 1000 [I.V.:1000] Out: 72 [Urine:50; Blood:5]  LOCAL MEDICATIONS USED:  LIDOCAINE   SPECIMEN:  Source of Specimen:  uterine currettings  DISPOSITION OF SPECIMEN:  PATHOLOGY  COUNTS:  YES  TOURNIQUET:  * No tourniquets in log *  DICTATION: .Note written in EPIC  PLAN OF CARE: Discharge to home after PACU  PATIENT DISPOSITION:  PACU - hemodynamically stable.   Delay start of Pharmacological VTE agent (>24hrs) due to surgical blood loss or risk of bleeding: not applicable  Findings:  Shaggy endometrium.  No independent polyps seen and no fibroids were pushing into EM.  Medications: none.  Complications: none.  After adequate anesthesia was achieved, the patient was prepped and draped in the usual sterile fashion.  The speculum was placed in the vagina and the cervix stabilized with Mcguire single-tooth tenaculum.  The cervix was dilated with pratt dilators and the hysteroscope passed inside the endometrial cavity.  The above findings were noted and sharp curettage was then performed and uterine curettings sent to path.  Without discreet polyps or fibroids seen, the myosure was not used.  The cavity length was 5 cm and the Novasure instrument successfully seated.  The width was 4.8 cm and the CO2 test passed.  The ablation went for 58 sec at at power of 132 watts.  Once the ablation was completed, the Novasure instrument was removed and the hysteroscope passed into the cavity again.  Good contact was seen in all areas.   All instruments were then removed from the uterus and vagina.  The patient tolerated the  procedure well.    Dana Mcguire

## 2015-11-13 NOTE — Progress Notes (Signed)
There has been no change in the patients history, status or exam since the history and physical.  Vitals:   11/13/15 0721  BP: 122/81  Pulse: 66  Resp: 20  Temp: 97.6 F (36.4 C)  TempSrc: Oral  SpO2: 100%    Lab Results  Component Value Date   WBC 6.7 11/07/2015   HGB 12.0 11/07/2015   HCT 34.9 (L) 11/07/2015   MCV 87.3 11/07/2015   PLT 394 11/07/2015    Darshana Curnutt A

## 2015-11-14 ENCOUNTER — Encounter (HOSPITAL_COMMUNITY): Payer: Self-pay | Admitting: Obstetrics and Gynecology

## 2015-11-15 NOTE — Anesthesia Postprocedure Evaluation (Signed)
Anesthesia Post Note  Patient: Dana Mcguire  Procedure(s) Performed: Procedure(s) (LRB): DILATATION & CURETTAGE/HYSTEROSCOPY WITH NOVASURE ABLATION (N/A)  Patient location during evaluation: PACU Anesthesia Type: General Level of consciousness: awake Pain management: pain level controlled Vital Signs Assessment: post-procedure vital signs reviewed and stable Respiratory status: spontaneous breathing Cardiovascular status: stable Postop Assessment: no signs of nausea or vomiting Anesthetic complications: no     Last Vitals:  Vitals:   11/13/15 1021 11/13/15 1100  BP:  124/81  Pulse: 63 67  Resp: 17 16  Temp:      Last Pain:  Vitals:   11/13/15 1021  TempSrc:   PainSc: 2    Pain Goal: Patients Stated Pain Goal: 4 (11/13/15 0721)               Valon Glasscock JR,JOHN Mateo Flow

## 2020-01-05 ENCOUNTER — Other Ambulatory Visit: Payer: Self-pay | Admitting: Obstetrics and Gynecology

## 2020-01-05 DIAGNOSIS — R928 Other abnormal and inconclusive findings on diagnostic imaging of breast: Secondary | ICD-10-CM

## 2020-01-06 ENCOUNTER — Other Ambulatory Visit: Payer: Managed Care, Other (non HMO)

## 2020-01-06 ENCOUNTER — Other Ambulatory Visit: Payer: Self-pay

## 2020-01-06 ENCOUNTER — Ambulatory Visit
Admission: RE | Admit: 2020-01-06 | Discharge: 2020-01-06 | Disposition: A | Discharge status: Home / Self Care | Payer: Self-pay | Source: Ambulatory Visit | Attending: Obstetrics and Gynecology | Admitting: Obstetrics and Gynecology

## 2020-01-06 ENCOUNTER — Ambulatory Visit
Admission: RE | Admit: 2020-01-06 | Discharge: 2020-01-06 | Disposition: A | Discharge status: Home / Self Care | Payer: BC Managed Care – PPO | Source: Ambulatory Visit | Attending: Obstetrics and Gynecology | Admitting: Obstetrics and Gynecology

## 2020-01-06 DIAGNOSIS — R928 Other abnormal and inconclusive findings on diagnostic imaging of breast: Secondary | ICD-10-CM

## 2020-01-10 ENCOUNTER — Other Ambulatory Visit: Payer: Managed Care, Other (non HMO)

## 2021-03-26 ENCOUNTER — Other Ambulatory Visit: Payer: Self-pay | Admitting: Obstetrics and Gynecology

## 2021-03-26 DIAGNOSIS — N644 Mastodynia: Secondary | ICD-10-CM

## 2021-04-18 ENCOUNTER — Ambulatory Visit: Payer: BC Managed Care – PPO

## 2021-04-18 ENCOUNTER — Other Ambulatory Visit: Payer: Self-pay

## 2021-04-18 ENCOUNTER — Ambulatory Visit
Admission: RE | Admit: 2021-04-18 | Discharge: 2021-04-18 | Disposition: A | Discharge status: Home / Self Care | Payer: BC Managed Care – PPO | Source: Ambulatory Visit | Attending: Obstetrics and Gynecology | Admitting: Obstetrics and Gynecology

## 2021-04-18 DIAGNOSIS — N644 Mastodynia: Secondary | ICD-10-CM

## 2022-05-19 ENCOUNTER — Other Ambulatory Visit: Payer: Self-pay | Admitting: Obstetrics and Gynecology

## 2022-05-19 DIAGNOSIS — R928 Other abnormal and inconclusive findings on diagnostic imaging of breast: Secondary | ICD-10-CM

## 2022-06-02 ENCOUNTER — Ambulatory Visit
Admission: RE | Admit: 2022-06-02 | Discharge: 2022-06-02 | Disposition: A | Discharge status: Home / Self Care | Payer: BC Managed Care – PPO | Source: Ambulatory Visit | Attending: Obstetrics and Gynecology | Admitting: Obstetrics and Gynecology

## 2022-06-02 DIAGNOSIS — R928 Other abnormal and inconclusive findings on diagnostic imaging of breast: Secondary | ICD-10-CM

## 2023-07-05 IMAGING — MG DIGITAL DIAGNOSTIC BILAT W/ TOMO W/ CAD
8 series · 8 of 24 positions shown · non-contrast
Comparison: Previous exam(s).

CLINICAL DATA: Patient describes tingling sensation in the nipples
bilaterally. Symptoms lasted for 1 month, last noticed 2-3 weeks
ago. Patient is asymptomatic currently. Patient has irregular
cycles.

EXAM:
DIGITAL DIAGNOSTIC BILATERAL MAMMOGRAM WITH TOMOSYNTHESIS AND CAD
TECHNIQUE: Bilateral digital diagnostic mammography and breast tomosynthesis
was performed. The images were evaluated with computer-aided
detection.

[R MLO synth-2D]
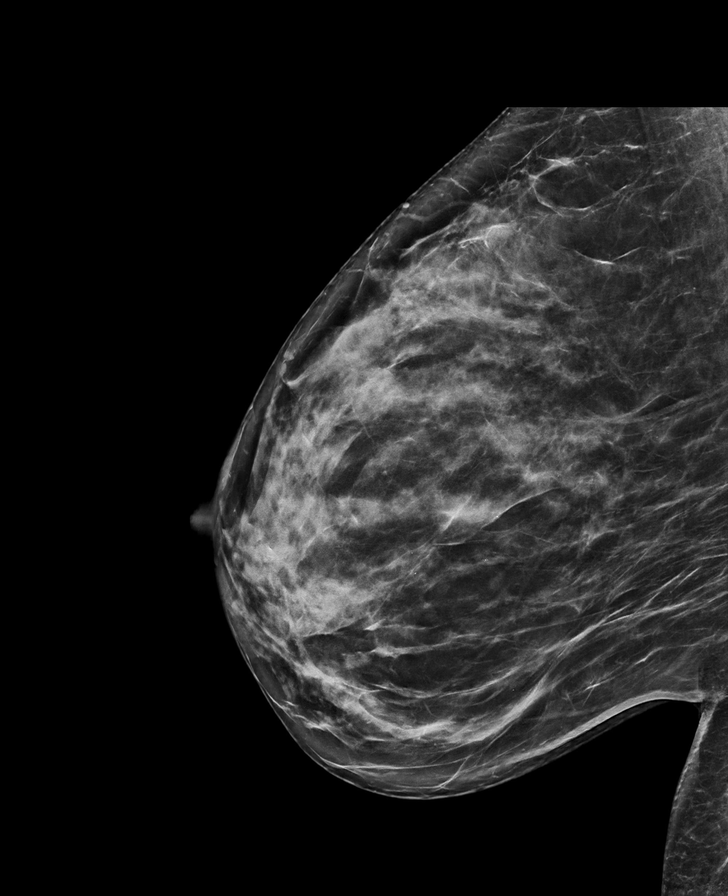

[L MLO synth-2D]
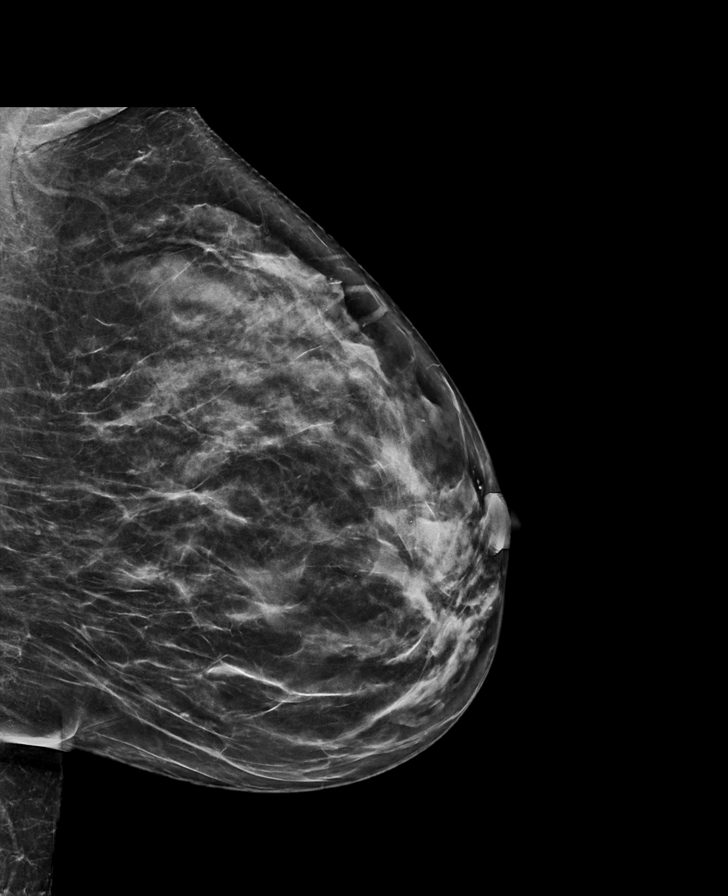

[R CC synth-2D]
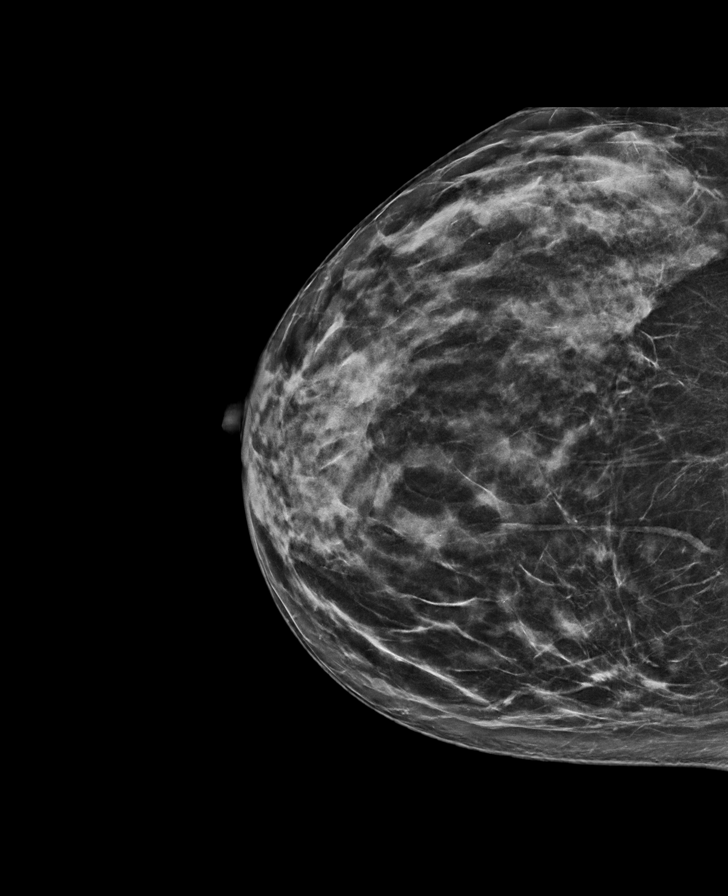

[L CC synth-2D]
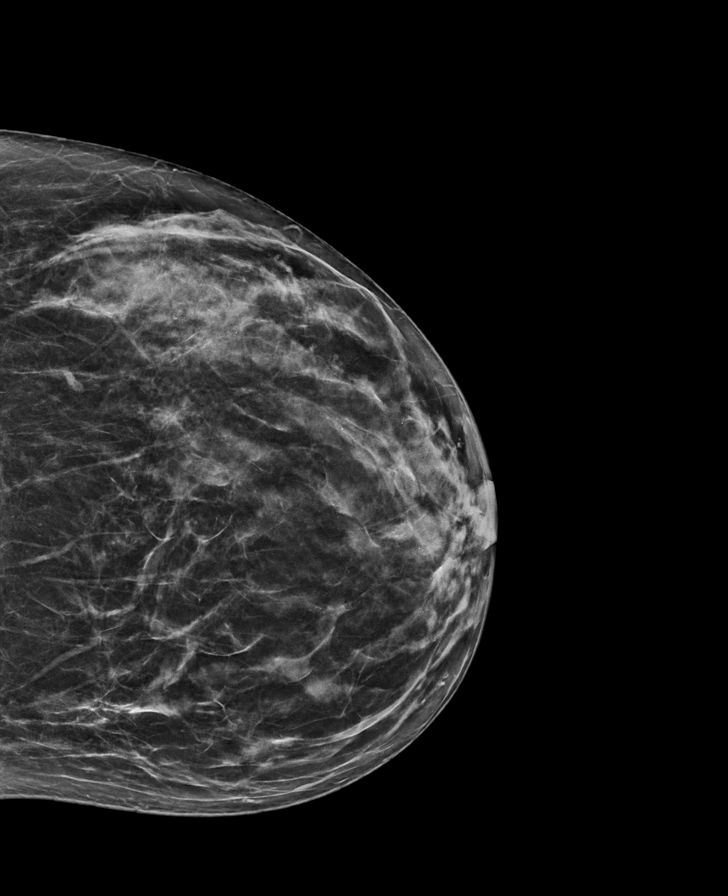

[L MLO tomo · tomo slice 37/74.0]
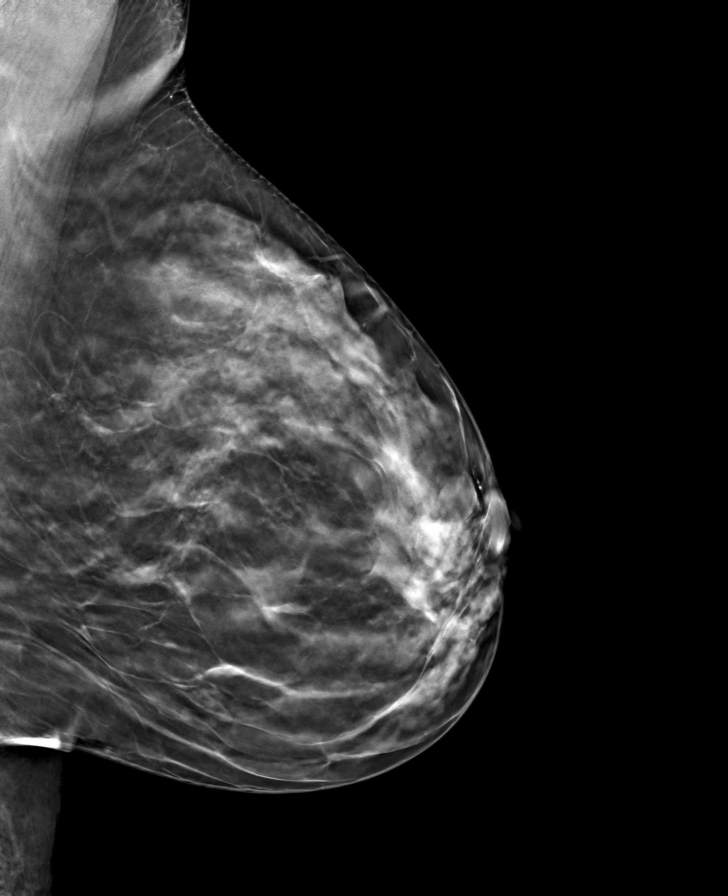

[R MLO tomo · tomo slice 39/77.0]
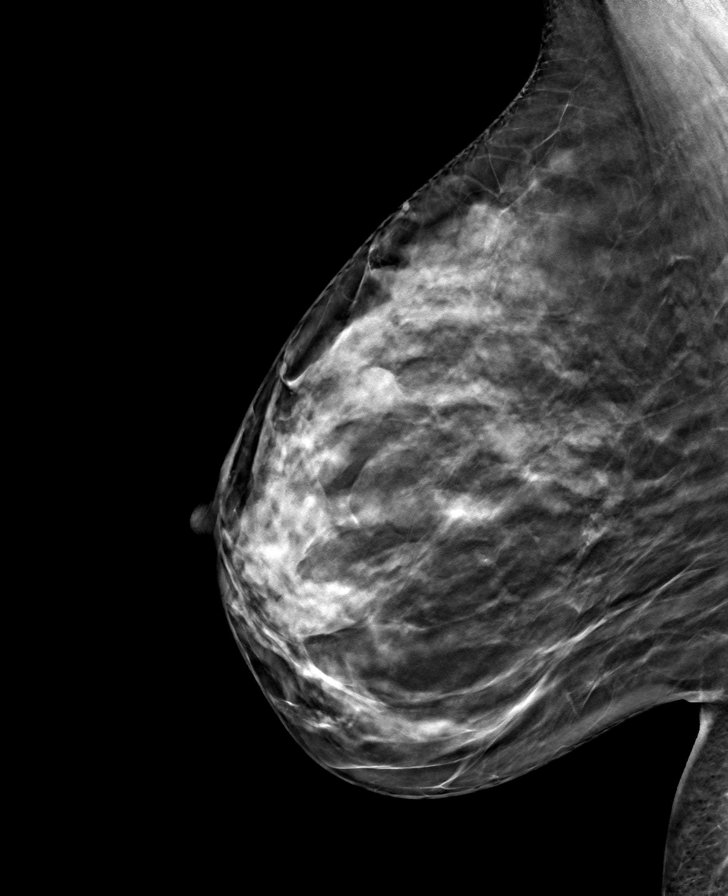

[L CC tomo · tomo slice 33/66.0]
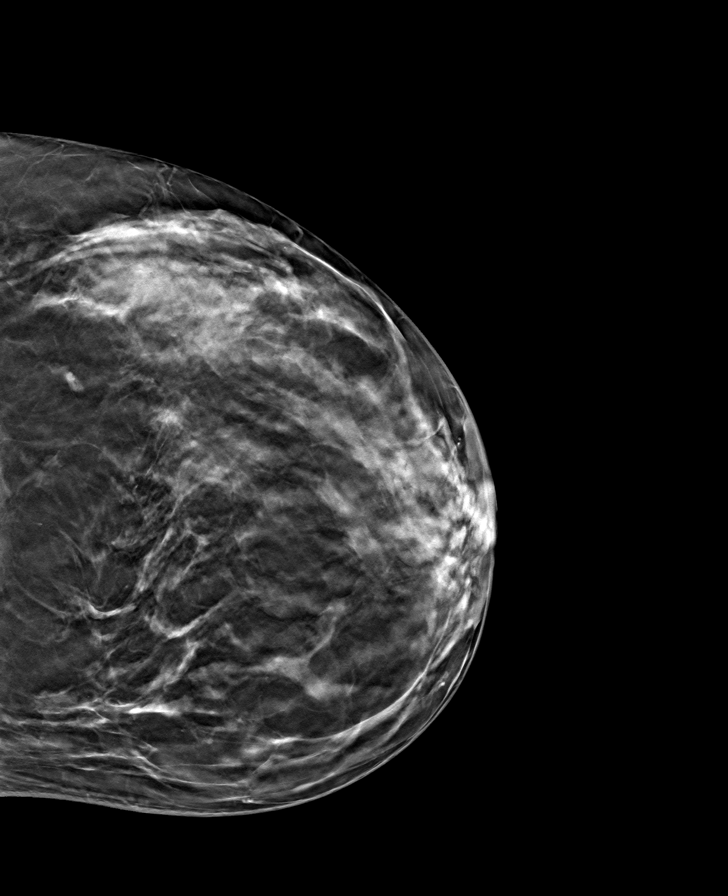

[R CC tomo · tomo slice 37/72.0]
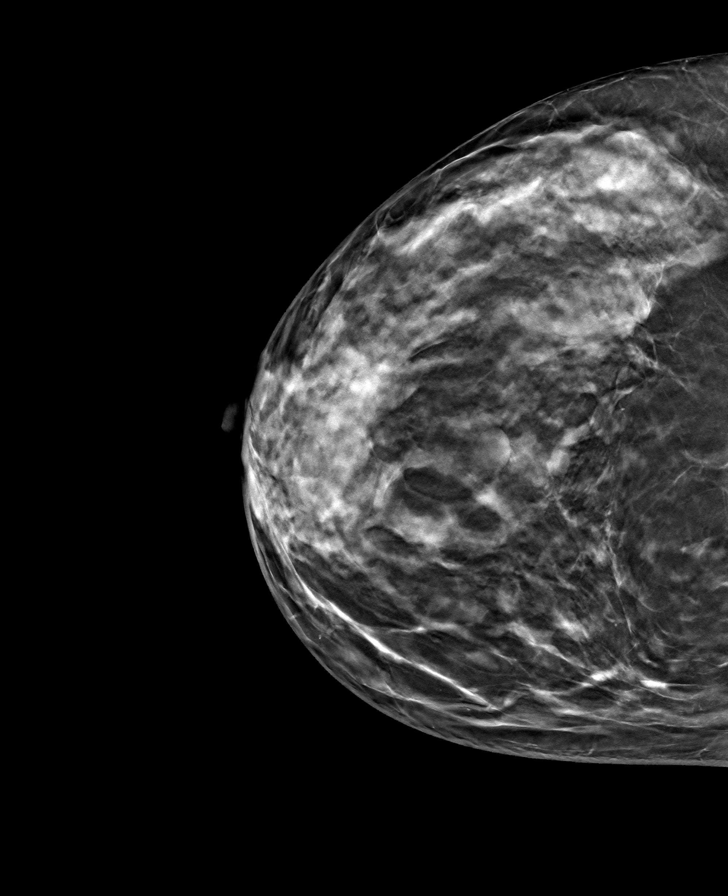

[8 of 24 positions shown; findings below may reference images not displayed]

ACR Breast Density Category c: The breast tissue is heterogeneously
dense, which may obscure small masses.
FINDINGS: No suspicious mass, distortion, or microcalcifications are
identified to suggest presence of malignancy.
IMPRESSION: No mammographic evidence for malignancy.

Diffuse breast pain is a common condition, and rarely a sign of
breast cancer. It can be exacerbated by hormonal changes, hormonal
medications, caffeine, and weight changes. It can be improved by
wearing adequate support, over-the-counter pain medication, low-fat
diet, exercise, and ice as needed. Topical medications containing
diclofenac or lidocaine may provide temporary pain relief. Studies
have shown an improvement in cyclic pain with use of evening
primrose oil and vitamin E. Often breast pain will resolve on its
own without intervention.

RECOMMENDATION:
Screening mammogram in one year.(Code:[id number])

I have discussed the findings and recommendations with the patient.
If applicable, a reminder letter will be sent to the patient
regarding the next appointment.

BI-RADS CATEGORY  1: Negative.
